# Patient Record
Sex: Female | Born: 1990 | Race: Black or African American | Hispanic: No | Marital: Single | Smoking: Current every day smoker
Health system: Southern US, Community
[De-identification: ages and names within clinical notes are randomized; demographics above are authoritative.]

## PROBLEM LIST (undated history)

## (undated) DIAGNOSIS — M1990 Unspecified osteoarthritis, unspecified site: Secondary | ICD-10-CM

## (undated) DIAGNOSIS — J45909 Unspecified asthma, uncomplicated: Secondary | ICD-10-CM

## (undated) HISTORY — DX: Unspecified asthma, uncomplicated: J45.909

---

## 2014-04-07 ENCOUNTER — Encounter (HOSPITAL_COMMUNITY): Payer: Self-pay

## 2014-04-07 ENCOUNTER — Emergency Department (HOSPITAL_COMMUNITY): Payer: No Typology Code available for payment source

## 2014-04-07 ENCOUNTER — Emergency Department (HOSPITAL_COMMUNITY)
Admission: EM | Admit: 2014-04-07 | Discharge: 2014-04-07 | Disposition: A | Discharge status: Home / Self Care | Payer: No Typology Code available for payment source | Attending: Emergency Medicine | Admitting: Emergency Medicine

## 2014-04-07 DIAGNOSIS — Y9389 Activity, other specified: Secondary | ICD-10-CM | POA: Diagnosis not present

## 2014-04-07 DIAGNOSIS — Y998 Other external cause status: Secondary | ICD-10-CM | POA: Diagnosis not present

## 2014-04-07 DIAGNOSIS — S3992XA Unspecified injury of lower back, initial encounter: Secondary | ICD-10-CM | POA: Insufficient documentation

## 2014-04-07 DIAGNOSIS — Z720 Tobacco use: Secondary | ICD-10-CM | POA: Insufficient documentation

## 2014-04-07 DIAGNOSIS — S24109A Unspecified injury at unspecified level of thoracic spinal cord, initial encounter: Secondary | ICD-10-CM | POA: Diagnosis not present

## 2014-04-07 DIAGNOSIS — S299XXA Unspecified injury of thorax, initial encounter: Secondary | ICD-10-CM | POA: Diagnosis not present

## 2014-04-07 DIAGNOSIS — M1990 Unspecified osteoarthritis, unspecified site: Secondary | ICD-10-CM | POA: Diagnosis not present

## 2014-04-07 DIAGNOSIS — Z79899 Other long term (current) drug therapy: Secondary | ICD-10-CM | POA: Diagnosis not present

## 2014-04-07 DIAGNOSIS — Y92410 Unspecified street and highway as the place of occurrence of the external cause: Secondary | ICD-10-CM | POA: Diagnosis not present

## 2014-04-07 HISTORY — DX: Unspecified osteoarthritis, unspecified site: M19.90

## 2014-04-07 MED ORDER — HYDROCODONE-ACETAMINOPHEN 5-325 MG PO TABS: 1.0000 | ORAL_TABLET | ORAL | Status: AC | PRN

## 2014-04-07 MED ORDER — HYDROCODONE-ACETAMINOPHEN 5-325 MG PO TABS
2.0000 | ORAL_TABLET | Freq: Once | ORAL | Status: AC
  Administered 2014-04-07: 2 via ORAL
  Filled 2014-04-07 (×2): qty 2

## 2014-04-07 NOTE — Discharge Instructions (Signed)
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
## 2014-04-07 NOTE — ED Notes (Signed)
Pt now requesting her pain medicine and crackers. Informed her we need to wait for results of x-rays. Pt verbalized understanding.
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
## 2014-04-27 ENCOUNTER — Encounter: Payer: Self-pay | Admitting: Internal Medicine

## 2014-04-27 ENCOUNTER — Ambulatory Visit: Payer: Self-pay | Attending: Internal Medicine | Admitting: Internal Medicine

## 2014-04-27 VITALS — BP 115/78 | HR 78 | Temp 98.2°F | Resp 16 | Ht 64.0 in | Wt 176.0 lb

## 2014-04-27 DIAGNOSIS — M545 Low back pain, unspecified: Secondary | ICD-10-CM

## 2014-04-27 DIAGNOSIS — Z720 Tobacco use: Secondary | ICD-10-CM | POA: Insufficient documentation

## 2014-04-27 DIAGNOSIS — J45909 Unspecified asthma, uncomplicated: Secondary | ICD-10-CM | POA: Insufficient documentation

## 2014-04-27 MED ORDER — TRAMADOL HCL 50 MG PO TABS: 50.0000 mg | ORAL_TABLET | Freq: Two times a day (BID) | ORAL | Status: AC | PRN

## 2014-04-27 MED ORDER — IBUPROFEN 800 MG PO TABS: 800.0000 mg | ORAL_TABLET | Freq: Three times a day (TID) | ORAL | Status: AC | PRN

## 2014-04-27 MED ORDER — CYCLOBENZAPRINE HCL 10 MG PO TABS: 10.0000 mg | ORAL_TABLET | Freq: Three times a day (TID) | ORAL | Status: AC | PRN

## 2014-04-27 NOTE — Patient Instructions (Signed)
Back Pain, Adult Low back pain is very common. About 1 in 5 people have back pain.The cause of low back pain is rarely dangerous. The pain often gets better over time.About half of people with a sudden onset of back pain feel better in just 2 weeks. About 8 in 10 people feel better by 6 weeks.  CAUSES Some common causes of back pain include:  Strain of the muscles or ligaments supporting the spine.  Wear and tear (degeneration) of the spinal discs.  Arthritis.  Direct injury to the back. DIAGNOSIS Most of the time, the direct cause of low back pain is not known.However, back pain can be treated effectively even when the exact cause of the pain is unknown.Answering your caregiver's questions about your overall health and symptoms is one of the most accurate ways to make sure the cause of your pain is not dangerous. If your caregiver needs more information, he or she may order lab work or imaging tests (X-rays or MRIs).However, even if imaging tests show changes in your back, this usually does not require surgery. HOME CARE INSTRUCTIONS For many people, back pain returns.Since low back pain is rarely dangerous, it is often a condition that people can learn to manageon their own.   Remain active. It is stressful on the back to sit or stand in one place. Do not sit, drive, or stand in one place for more than 30 minutes at a time. Take short walks on level surfaces as soon as pain allows.Try to increase the length of time you walk each day.  Do not stay in bed.Resting more than 1 or 2 days can delay your recovery.  Do not avoid exercise or work.Your body is made to move.It is not dangerous to be active, even though your back may hurt.Your back will likely heal faster if you return to being active before your pain is gone.  Pay attention to your body when you bend and lift. Many people have less discomfortwhen lifting if they bend their knees, keep the load close to their bodies,and  avoid twisting. Often, the most comfortable positions are those that put less stress on your recovering back.  Find a comfortable position to sleep. Use a firm mattress and lie on your side with your knees slightly bent. If you lie on your back, put a pillow under your knees.  Only take over-the-counter or prescription medicines as directed by your caregiver. Over-the-counter medicines to reduce pain and inflammation are often the most helpful.Your caregiver may prescribe muscle relaxant drugs.These medicines help dull your pain so you can more quickly return to your normal activities and healthy exercise.  Put ice on the injured area.  Put ice in a plastic bag.  Place a towel between your skin and the bag.  Leave the ice on for 15-20 minutes, 03-04 times a day for the first 2 to 3 days. After that, ice and heat may be alternated to reduce pain and spasms.  Ask your caregiver about trying back exercises and gentle massage. This may be of some benefit.  Avoid feeling anxious or stressed.Stress increases muscle tension and can worsen back pain.It is important to recognize when you are anxious or stressed and learn ways to manage it.Exercise is a great option. SEEK MEDICAL CARE IF:  You have pain that is not relieved with rest or medicine.  You have pain that does not improve in 1 week.  You have new symptoms.  You are generally not feeling well. SEEK   IMMEDIATE MEDICAL CARE IF:   You have pain that radiates from your back into your legs.  You develop new bowel or bladder control problems.  You have unusual weakness or numbness in your arms or legs.  You develop nausea or vomiting.  You develop abdominal pain.  You feel faint. Document Released: 02/04/2005 Document Revised: 08/06/2011 Document Reviewed: 06/08/2013 Exit[employer] Patient Information 2015 [employer]. This information is not intended to replace advice given to you by your health care provider. Make sure you  discuss any questions you have with your health care provider.  
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
